# Patient Record
Sex: Male | Born: 1995 | Race: Black or African American | Hispanic: No | Marital: Married | Smoking: Never smoker
Health system: Southern US, Community
[De-identification: ages and names within clinical notes are randomized; demographics above are authoritative.]

---
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
## 2022-07-06 ENCOUNTER — Emergency Department (HOSPITAL_BASED_OUTPATIENT_CLINIC_OR_DEPARTMENT_OTHER)
Admission: EM | Admit: 2022-07-06 | Discharge: 2022-07-06 | Disposition: A | Discharge status: Home / Self Care | Payer: Self-pay | Attending: Emergency Medicine | Admitting: Emergency Medicine

## 2022-07-06 ENCOUNTER — Encounter (HOSPITAL_BASED_OUTPATIENT_CLINIC_OR_DEPARTMENT_OTHER): Payer: Self-pay | Admitting: Emergency Medicine

## 2022-07-06 ENCOUNTER — Emergency Department (HOSPITAL_BASED_OUTPATIENT_CLINIC_OR_DEPARTMENT_OTHER): Payer: Self-pay

## 2022-07-06 ENCOUNTER — Other Ambulatory Visit: Payer: Self-pay

## 2022-07-06 DIAGNOSIS — N50812 Left testicular pain: Secondary | ICD-10-CM

## 2022-07-06 LAB — URINALYSIS, ROUTINE W REFLEX MICROSCOPIC
Bilirubin Urine: NEGATIVE
Glucose, UA: NEGATIVE mg/dL
Hgb urine dipstick: NEGATIVE
Ketones, ur: NEGATIVE mg/dL
Leukocytes,Ua: NEGATIVE
Nitrite: NEGATIVE
Specific Gravity, Urine: 1.032 — ABNORMAL HIGH (ref 1.005–1.030)
pH: 6.5 (ref 5.0–8.0)

## 2022-07-06 MED ORDER — MELOXICAM 15 MG PO TABS: 15.0000 mg | ORAL_TABLET | Freq: Every day | ORAL | 0 refills | Status: AC

## 2022-07-06 NOTE — ED Triage Notes (Signed)
  Patient comes in with L testicle pain that started about 4-5 hours ago.  Patient states he was laying on his side and felt "tugging" in L testicle that was concerning.  Denies any dysuria.  Patient sexually active earlier yesterday and denies multiple partners.  Pain 5/10, nagging.

## 2022-07-06 NOTE — ED Provider Notes (Signed)
[facility] Provider Note   CSN: [id number] Arrival date & time: 07/06/22  0436     History  Chief Complaint  Patient presents with   Testicle Pain    Gary Morton is a 27 y.o. male.  27 year old male who presents ER today with testicular pain.  States is in his left testicle.  States that started about 3 to 4 hours ago.  Worse with certain movements.  No dysuria or penile discharge.  No change in bowel movements.  No history of kidney stones or hematuria.  No pain right now.   Testicle Pain       Home Medications Prior to Admission medications   Medication Sig Start Date End Date Taking? Authorizing Provider  meloxicam (MOBIC) 15 MG tablet Take 1 tablet (15 mg total) by mouth daily. 07/06/22  Yes Raidyn Wassink, Corene Cornea, MD  albuterol (PROVENTIL HFA;VENTOLIN HFA) 108 (90 BASE) MCG/ACT inhaler Inhale 2 puffs into the lungs every 6 (six) hours as needed.    [provider]  cetirizine (ZYRTEC) 10 MG chewable tablet Chew 1 tablet (10 mg total) by mouth daily. 02/25/12 03/27/12  Glynis Smiles, DO      Allergies    Patient has no known allergies.    Review of Systems   Review of Systems  Genitourinary:  Positive for testicular pain.    Physical Exam Updated Vital Signs BP 119/85   Pulse 82   Temp 98.1 F (36.7 C) (Oral)   Resp 16   Ht 6' 5"$  (1.956 m)   Wt (!) 204.1 kg   SpO2 100%   BMI 53.36 kg/m  Physical Exam Vitals and nursing note reviewed.  Constitutional:      Appearance: He is well-developed.  HENT:     Head: Normocephalic and atraumatic.  Eyes:     Pupils: Pupils are equal, round, and reactive to light.  Cardiovascular:     Rate and Rhythm: Normal rate.  Pulmonary:     Effort: Pulmonary effort is normal. No respiratory distress.  Abdominal:     General: Abdomen is flat. There is no distension.  Genitourinary:    Testes:        Right: Tenderness, testicular hydrocele or varicocele not present. Cremasteric  reflex is present.         Left: Tenderness, testicular hydrocele or varicocele not present. Cremasteric reflex is present.      Comments: Chaperoned by nurse Mitzi Hansen Musculoskeletal:        General: Normal range of motion.     Cervical back: Normal range of motion.  Neurological:     General: No focal deficit present.     Mental Status: He is alert.     ED Results / Procedures / Treatments   Labs (all labs ordered are listed, but only abnormal results are displayed) Labs Reviewed  URINALYSIS, ROUTINE W REFLEX MICROSCOPIC - Abnormal; Notable for the following components:      Result Value   Specific Gravity, Urine 1.032 (*)    Protein, ur TRACE (*)    All other components within normal limits    EKG None  Radiology US SCROTUM W/DOPPLER  Result Date: 07/06/2022 CLINICAL DATA:  27 year old male with history of left-sided testicular pain for the past 4-5 hours. EXAM: SCROTAL ULTRASOUND DOPPLER ULTRASOUND OF THE TESTICLES TECHNIQUE: Complete ultrasound examination of the testicles, epididymis, and other scrotal structures was performed. Color and spectral Doppler ultrasound were also utilized to evaluate blood flow to the testicles. COMPARISON:  No priors. FINDINGS: Right testicle Measurements: 4.4 x 2.5 x 2.8 cm. No mass or microlithiasis visualized. Left testicle Measurements: 3.4 x 2.4 x 2.8 cm. No mass or microlithiasis visualized. Right epididymis:  Normal in size and appearance. Left epididymis:  Normal in size and appearance. Hydrocele:  None visualized. Varicocele:  None visualized. Pulsed Doppler interrogation of both testes demonstrates normal low resistance arterial and venous waveforms bilaterally. IMPRESSION: 1. No acute findings to account for the patient's symptoms. Normal sonographic appearance of the testicles and epididymides bilaterally. Electronically Signed   By: Vinnie Langton M.D.   On: 07/06/2022 06:21    Procedures Procedures    Medications Ordered in  ED Medications - No data to display  ED Course/ Medical Decision Making/ A&P                             Medical Decision Making Amount and/or Complexity of Data Reviewed Labs: ordered. Radiology: ordered.  Risk Prescription drug management.   Possibly muscular versus ligamentous pain.  No evidence of testicular torsion or epididymitis or UTI or hernia on ultrasound, labs and physical exam. Will start NSAIDs and urology follow up if not improving.   Final Clinical Impression(s) / ED Diagnoses Final diagnoses:  Pain in left testicle    Rx / DC Orders ED Discharge Orders          Ordered    meloxicam (MOBIC) 15 MG tablet  Daily        07/06/22 0625              Gevin Perea, Corene Cornea, MD 07/06/22 [phone]
